# Patient Record
Sex: Female | Born: 1970 | Race: White | Hispanic: Yes | Smoking: Never smoker
Health system: Southern US, Community
[De-identification: ages and names within clinical notes are randomized; demographics above are authoritative.]

---

## 1997-11-30 ENCOUNTER — Other Ambulatory Visit: Admission: RE | Admit: 1997-11-30 | Discharge: 1997-11-30 | Payer: Self-pay | Admitting: Family Medicine

## 1998-09-09 ENCOUNTER — Other Ambulatory Visit: Admission: RE | Admit: 1998-09-09 | Discharge: 1998-09-09 | Payer: Self-pay | Admitting: Family Medicine

## 1999-07-15 ENCOUNTER — Emergency Department (HOSPITAL_COMMUNITY): Admission: EM | Admit: 1999-07-15 | Discharge: 1999-07-15 | Payer: Self-pay | Admitting: Emergency Medicine

## 2005-01-28 ENCOUNTER — Emergency Department (HOSPITAL_COMMUNITY): Admission: EM | Admit: 2005-01-28 | Discharge: 2005-01-28 | Payer: Self-pay | Admitting: Emergency Medicine

## 2005-04-26 ENCOUNTER — Ambulatory Visit (HOSPITAL_COMMUNITY): Admission: RE | Admit: 2005-04-26 | Discharge: 2005-04-26 | Payer: Self-pay | Admitting: *Deleted

## 2005-09-26 ENCOUNTER — Inpatient Hospital Stay (HOSPITAL_COMMUNITY): Admission: AD | Admit: 2005-09-26 | Discharge: 2005-09-30 | Payer: Self-pay | Admitting: *Deleted

## 2005-09-26 ENCOUNTER — Ambulatory Visit: Payer: Self-pay | Admitting: Obstetrics and Gynecology

## 2012-10-25 ENCOUNTER — Other Ambulatory Visit (HOSPITAL_COMMUNITY): Payer: Self-pay | Admitting: Nurse Practitioner

## 2012-10-25 DIAGNOSIS — Z1231 Encounter for screening mammogram for malignant neoplasm of breast: Secondary | ICD-10-CM

## 2012-11-13 ENCOUNTER — Ambulatory Visit (HOSPITAL_COMMUNITY)
Admission: RE | Admit: 2012-11-13 | Discharge: 2012-11-13 | Disposition: A | Discharge status: Home / Self Care | Payer: Self-pay | Source: Ambulatory Visit | Attending: Nurse Practitioner | Admitting: Nurse Practitioner

## 2012-11-13 DIAGNOSIS — Z1231 Encounter for screening mammogram for malignant neoplasm of breast: Secondary | ICD-10-CM

## 2017-02-07 ENCOUNTER — Other Ambulatory Visit: Payer: Self-pay | Admitting: Nurse Practitioner

## 2017-02-07 DIAGNOSIS — Z1231 Encounter for screening mammogram for malignant neoplasm of breast: Secondary | ICD-10-CM

## 2017-07-10 ENCOUNTER — Emergency Department (HOSPITAL_COMMUNITY)
Admission: EM | Admit: 2017-07-10 | Discharge: 2017-07-10 | Disposition: A | Discharge status: Home / Self Care | Payer: No Typology Code available for payment source | Attending: Emergency Medicine | Admitting: Emergency Medicine

## 2017-07-10 ENCOUNTER — Emergency Department (HOSPITAL_COMMUNITY): Payer: No Typology Code available for payment source

## 2017-07-10 ENCOUNTER — Other Ambulatory Visit: Payer: Self-pay

## 2017-07-10 ENCOUNTER — Encounter (HOSPITAL_COMMUNITY): Payer: Self-pay | Admitting: Emergency Medicine

## 2017-07-10 DIAGNOSIS — M542 Cervicalgia: Secondary | ICD-10-CM | POA: Diagnosis present

## 2017-07-10 DIAGNOSIS — R51 Headache: Secondary | ICD-10-CM | POA: Insufficient documentation

## 2017-07-10 DIAGNOSIS — H9202 Otalgia, left ear: Secondary | ICD-10-CM | POA: Diagnosis not present

## 2017-07-10 DIAGNOSIS — M25512 Pain in left shoulder: Secondary | ICD-10-CM | POA: Insufficient documentation

## 2017-07-10 MED ORDER — METHOCARBAMOL 500 MG PO TABS: 500.0000 mg | ORAL_TABLET | Freq: Three times a day (TID) | ORAL | 0 refills | Status: DC | PRN

## 2017-07-10 NOTE — ED Notes (Signed)
Pt refused xray, sent back to ED
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
## 2017-07-10 NOTE — Discharge Instructions (Signed)
You demanded x-rays and CT scans. All were normal.   Take tylenol and ibuprofen for pain. Robaxin for muscle stiffness and spasms.
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
## 2017-07-10 NOTE — ED Notes (Signed)
Spoke with provider regarding patient refusing xray. Provider speaking with patient and family member at bedside.

## 2017-07-11 ENCOUNTER — Emergency Department (HOSPITAL_COMMUNITY)
Admission: EM | Admit: 2017-07-11 | Discharge: 2017-07-11 | Disposition: A | Discharge status: Home / Self Care | Payer: No Typology Code available for payment source | Attending: Emergency Medicine | Admitting: Emergency Medicine

## 2017-07-11 ENCOUNTER — Encounter (HOSPITAL_COMMUNITY): Payer: Self-pay | Admitting: Emergency Medicine

## 2017-07-11 DIAGNOSIS — M549 Dorsalgia, unspecified: Secondary | ICD-10-CM | POA: Diagnosis not present

## 2017-07-11 DIAGNOSIS — R51 Headache: Secondary | ICD-10-CM | POA: Diagnosis not present

## 2017-07-11 DIAGNOSIS — M7918 Myalgia, other site: Secondary | ICD-10-CM

## 2017-07-11 DIAGNOSIS — Y92410 Unspecified street and highway as the place of occurrence of the external cause: Secondary | ICD-10-CM | POA: Diagnosis not present

## 2017-07-11 DIAGNOSIS — Y999 Unspecified external cause status: Secondary | ICD-10-CM | POA: Diagnosis not present

## 2017-07-11 DIAGNOSIS — Y9389 Activity, other specified: Secondary | ICD-10-CM | POA: Insufficient documentation

## 2017-07-11 MED ORDER — ACETAMINOPHEN 325 MG PO TABS
650.0000 mg | ORAL_TABLET | Freq: Once | ORAL | Status: AC
  Administered 2017-07-11: 650 mg via ORAL
  Filled 2017-07-11: qty 2

## 2017-07-11 NOTE — ED Triage Notes (Signed)
Unable to locate PT in front lobby. X-ray  Dept called , unable to located PT. Ct Dept called unable to locate Pt.

## 2017-07-11 NOTE — Discharge Instructions (Signed)
Please read attached information. If you experience any new or worsening signs or symptoms please return to the emergency room for evaluation. Please follow-up with your primary care provider or specialist as discussed.  °
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
## 2017-07-11 NOTE — ED Notes (Signed)
Declined W/C at D/C and was escorted to lobby by RN. 
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
## 2017-07-23 ENCOUNTER — Ambulatory Visit (INDEPENDENT_AMBULATORY_CARE_PROVIDER_SITE_OTHER): Payer: Self-pay | Admitting: Orthopaedic Surgery

## 2017-07-23 ENCOUNTER — Ambulatory Visit (INDEPENDENT_AMBULATORY_CARE_PROVIDER_SITE_OTHER): Payer: Self-pay

## 2017-07-23 ENCOUNTER — Encounter (INDEPENDENT_AMBULATORY_CARE_PROVIDER_SITE_OTHER): Payer: Self-pay | Admitting: Orthopaedic Surgery

## 2017-07-23 DIAGNOSIS — M5441 Lumbago with sciatica, right side: Secondary | ICD-10-CM | POA: Insufficient documentation

## 2017-07-23 DIAGNOSIS — M545 Low back pain: Secondary | ICD-10-CM

## 2017-07-23 DIAGNOSIS — M25561 Pain in right knee: Secondary | ICD-10-CM

## 2017-07-23 DIAGNOSIS — M25511 Pain in right shoulder: Secondary | ICD-10-CM

## 2017-07-23 DIAGNOSIS — G8929 Other chronic pain: Secondary | ICD-10-CM | POA: Insufficient documentation

## 2017-07-23 MED ORDER — PREDNISONE 10 MG (21) PO TBPK: ORAL_TABLET | ORAL | 0 refills | Status: DC

## 2017-07-23 MED ORDER — METHOCARBAMOL 500 MG PO TABS: 500.0000 mg | ORAL_TABLET | Freq: Three times a day (TID) | ORAL | 0 refills | Status: DC | PRN
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
## 2017-08-03 ENCOUNTER — Encounter: Payer: Self-pay | Admitting: Internal Medicine

## 2017-08-03 ENCOUNTER — Ambulatory Visit (INDEPENDENT_AMBULATORY_CARE_PROVIDER_SITE_OTHER): Payer: Self-pay | Admitting: Internal Medicine

## 2017-08-03 DIAGNOSIS — H9192 Unspecified hearing loss, left ear: Secondary | ICD-10-CM

## 2017-08-03 DIAGNOSIS — F419 Anxiety disorder, unspecified: Secondary | ICD-10-CM

## 2017-08-03 DIAGNOSIS — R202 Paresthesia of skin: Secondary | ICD-10-CM

## 2017-08-03 DIAGNOSIS — R450 Nervousness: Secondary | ICD-10-CM

## 2017-08-03 DIAGNOSIS — M542 Cervicalgia: Secondary | ICD-10-CM

## 2017-08-03 DIAGNOSIS — G8911 Acute pain due to trauma: Secondary | ICD-10-CM

## 2017-08-03 DIAGNOSIS — M545 Low back pain: Secondary | ICD-10-CM

## 2017-08-03 DIAGNOSIS — M791 Myalgia, unspecified site: Secondary | ICD-10-CM

## 2017-08-03 MED ORDER — NAPROXEN 500 MG PO TABS: 500.0000 mg | ORAL_TABLET | Freq: Two times a day (BID) | ORAL | 0 refills | Status: DC

## 2017-08-03 MED ORDER — CYCLOBENZAPRINE HCL 5 MG PO TABS: 5.0000 mg | ORAL_TABLET | Freq: Three times a day (TID) | ORAL | 0 refills | Status: DC | PRN

## 2017-08-03 NOTE — Progress Notes (Signed)
Internal Medicine Clinic Attending  Case discussed with Dr. Wallace at the time of the visit.  We reviewed the resident's history and exam and pertinent patient test results.  I agree with the assessment, diagnosis, and plan of care documented in the resident's note.  
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
## 2017-08-03 NOTE — Patient Instructions (Signed)
FOLLOW-UP INSTRUCTIONS When: 2-4 weeks For: pain What to bring: medications  I want to check a couple labs today to make sure you are not having ongoing muscle injury to explain your pain.  Otherwise I have prescribed 2 medications  1. Flexeril for muscle spasm 2. Naproxen for pain  Take both only as needed.  Also follow up with orthopedics as planned.
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
## 2017-08-04 LAB — BMP8+ANION GAP
Anion Gap: 15 mmol/L (ref 10.0–18.0)
BUN / CREAT RATIO: 22 (ref 9–23)
BUN: 14 mg/dL (ref 6–24)
CALCIUM: 9.2 mg/dL (ref 8.7–10.2)
CO2: 22 mmol/L (ref 20–29)
CREATININE: 0.65 mg/dL (ref 0.57–1.00)
Chloride: 104 mmol/L (ref 96–106)
GFR calc non Af Amer: 108 mL/min/{1.73_m2} (ref >59)
GFR, EST AFRICAN AMERICAN: 125 mL/min/{1.73_m2} (ref >59)
Glucose: 86 mg/dL (ref 65–99)
Potassium: 4.2 mmol/L (ref 3.5–5.2)
Sodium: 141 mmol/L (ref 134–144)

## 2017-08-04 LAB — CK: CK TOTAL: 67 U/L (ref 24–173)

## 2017-08-09 ENCOUNTER — Ambulatory Visit: Payer: Self-pay

## 2017-08-17 ENCOUNTER — Encounter: Payer: Self-pay | Admitting: Internal Medicine

## 2017-08-17 ENCOUNTER — Ambulatory Visit (INDEPENDENT_AMBULATORY_CARE_PROVIDER_SITE_OTHER): Payer: Self-pay | Admitting: Internal Medicine

## 2017-08-17 ENCOUNTER — Other Ambulatory Visit: Payer: Self-pay

## 2017-08-17 DIAGNOSIS — F332 Major depressive disorder, recurrent severe without psychotic features: Secondary | ICD-10-CM

## 2017-08-17 DIAGNOSIS — Z79899 Other long term (current) drug therapy: Secondary | ICD-10-CM

## 2017-08-17 DIAGNOSIS — H9190 Unspecified hearing loss, unspecified ear: Secondary | ICD-10-CM | POA: Insufficient documentation

## 2017-08-17 DIAGNOSIS — F419 Anxiety disorder, unspecified: Secondary | ICD-10-CM

## 2017-08-17 DIAGNOSIS — R002 Palpitations: Secondary | ICD-10-CM

## 2017-08-17 DIAGNOSIS — F430 Acute stress reaction: Secondary | ICD-10-CM

## 2017-08-17 DIAGNOSIS — M542 Cervicalgia: Secondary | ICD-10-CM

## 2017-08-17 DIAGNOSIS — G8911 Acute pain due to trauma: Secondary | ICD-10-CM

## 2017-08-17 DIAGNOSIS — R51 Headache: Secondary | ICD-10-CM

## 2017-08-17 MED ORDER — NAPROXEN 500 MG PO TABS: 500.0000 mg | ORAL_TABLET | Freq: Two times a day (BID) | ORAL | 0 refills | Status: DC

## 2017-08-17 MED ORDER — DULOXETINE HCL 30 MG PO CPEP: 30.0000 mg | ORAL_CAPSULE | Freq: Every day | ORAL | 2 refills | Status: DC

## 2017-08-17 MED ORDER — CYCLOBENZAPRINE HCL 5 MG PO TABS: 5.0000 mg | ORAL_TABLET | Freq: Three times a day (TID) | ORAL | 1 refills | Status: DC | PRN
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
## 2017-08-23 NOTE — Progress Notes (Signed)
Internal Medicine Clinic Attending  Case discussed with Dr. Molt at the time of the visit.  We reviewed the resident's history and exam and pertinent patient test results.  I agree with the assessment, diagnosis, and plan of care documented in the resident's note. 

## 2017-08-29 ENCOUNTER — Ambulatory Visit: Payer: Self-pay

## 2017-09-03 ENCOUNTER — Ambulatory Visit: Payer: Self-pay

## 2017-09-06 ENCOUNTER — Telehealth: Payer: Self-pay | Admitting: Internal Medicine

## 2017-09-06 NOTE — Telephone Encounter (Signed)
Pt called to notify you that she now has the [place] and would like her ENT Referral placed.

## 2017-09-07 NOTE — Telephone Encounter (Signed)
I never met this patient, please schedule an appointment with me.

## 2017-09-10 ENCOUNTER — Encounter (INDEPENDENT_AMBULATORY_CARE_PROVIDER_SITE_OTHER): Payer: Self-pay | Admitting: Orthopaedic Surgery

## 2017-09-10 ENCOUNTER — Ambulatory Visit (INDEPENDENT_AMBULATORY_CARE_PROVIDER_SITE_OTHER): Payer: Self-pay

## 2017-09-10 ENCOUNTER — Ambulatory Visit (INDEPENDENT_AMBULATORY_CARE_PROVIDER_SITE_OTHER): Payer: Self-pay | Admitting: Orthopaedic Surgery

## 2017-09-10 DIAGNOSIS — M545 Low back pain: Secondary | ICD-10-CM

## 2017-09-10 DIAGNOSIS — G8929 Other chronic pain: Secondary | ICD-10-CM

## 2017-09-10 DIAGNOSIS — M25511 Pain in right shoulder: Secondary | ICD-10-CM

## 2017-09-10 DIAGNOSIS — M25562 Pain in left knee: Secondary | ICD-10-CM

## 2017-09-10 NOTE — Addendum Note (Signed)
Addended by: Brand Males E on: 09/10/2017 11:44 AM   Modules accepted: Orders
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
## 2017-09-12 NOTE — Telephone Encounter (Signed)
Patient is sch to see you on 09/28/2017.

## 2017-09-12 NOTE — Telephone Encounter (Signed)
Ok, thanks.

## 2017-09-17 ENCOUNTER — Ambulatory Visit: Payer: No Typology Code available for payment source | Attending: Orthopaedic Surgery | Admitting: Physical Therapy

## 2017-09-17 ENCOUNTER — Other Ambulatory Visit: Payer: Self-pay

## 2017-09-17 ENCOUNTER — Encounter: Payer: Self-pay | Admitting: Physical Therapy

## 2017-09-17 DIAGNOSIS — M25611 Stiffness of right shoulder, not elsewhere classified: Secondary | ICD-10-CM

## 2017-09-17 DIAGNOSIS — M5442 Lumbago with sciatica, left side: Secondary | ICD-10-CM | POA: Diagnosis present

## 2017-09-17 DIAGNOSIS — M5441 Lumbago with sciatica, right side: Secondary | ICD-10-CM

## 2017-09-17 DIAGNOSIS — R262 Difficulty in walking, not elsewhere classified: Secondary | ICD-10-CM | POA: Diagnosis present

## 2017-09-17 DIAGNOSIS — M6281 Muscle weakness (generalized): Secondary | ICD-10-CM | POA: Diagnosis present

## 2017-09-17 DIAGNOSIS — M25511 Pain in right shoulder: Secondary | ICD-10-CM | POA: Insufficient documentation
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
## 2017-09-19 ENCOUNTER — Ambulatory Visit
Admission: RE | Admit: 2017-09-19 | Discharge: 2017-09-19 | Disposition: A | Discharge status: Home / Self Care | Payer: Self-pay | Source: Ambulatory Visit | Attending: Orthopaedic Surgery | Admitting: Orthopaedic Surgery

## 2017-09-19 DIAGNOSIS — M545 Low back pain: Secondary | ICD-10-CM

## 2017-09-24 ENCOUNTER — Encounter (INDEPENDENT_AMBULATORY_CARE_PROVIDER_SITE_OTHER): Payer: Self-pay | Admitting: Orthopaedic Surgery

## 2017-09-24 ENCOUNTER — Ambulatory Visit (INDEPENDENT_AMBULATORY_CARE_PROVIDER_SITE_OTHER): Payer: Self-pay | Admitting: Orthopaedic Surgery

## 2017-09-24 DIAGNOSIS — M5416 Radiculopathy, lumbar region: Secondary | ICD-10-CM
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
## 2017-09-24 NOTE — Addendum Note (Signed)
Addended by: Precious Bard on: 09/24/2017 09:52 AM   Modules accepted: Orders

## 2017-09-25 ENCOUNTER — Encounter: Payer: Self-pay | Admitting: Physical Therapy

## 2017-09-25 ENCOUNTER — Ambulatory Visit: Payer: No Typology Code available for payment source | Admitting: Physical Therapy

## 2017-09-25 DIAGNOSIS — M25611 Stiffness of right shoulder, not elsewhere classified: Secondary | ICD-10-CM

## 2017-09-25 DIAGNOSIS — M5442 Lumbago with sciatica, left side: Secondary | ICD-10-CM

## 2017-09-25 DIAGNOSIS — R262 Difficulty in walking, not elsewhere classified: Secondary | ICD-10-CM

## 2017-09-25 DIAGNOSIS — M6281 Muscle weakness (generalized): Secondary | ICD-10-CM

## 2017-09-25 DIAGNOSIS — M25511 Pain in right shoulder: Secondary | ICD-10-CM

## 2017-09-25 DIAGNOSIS — M5441 Lumbago with sciatica, right side: Secondary | ICD-10-CM
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
## 2017-09-27 ENCOUNTER — Ambulatory Visit: Payer: No Typology Code available for payment source | Admitting: Physical Therapy

## 2017-09-27 ENCOUNTER — Encounter: Payer: Self-pay | Admitting: Physical Therapy

## 2017-09-27 DIAGNOSIS — M5442 Lumbago with sciatica, left side: Secondary | ICD-10-CM | POA: Diagnosis not present

## 2017-09-27 DIAGNOSIS — M25611 Stiffness of right shoulder, not elsewhere classified: Secondary | ICD-10-CM

## 2017-09-27 DIAGNOSIS — M25511 Pain in right shoulder: Secondary | ICD-10-CM

## 2017-09-27 DIAGNOSIS — R262 Difficulty in walking, not elsewhere classified: Secondary | ICD-10-CM

## 2017-09-27 DIAGNOSIS — M6281 Muscle weakness (generalized): Secondary | ICD-10-CM

## 2017-09-27 DIAGNOSIS — M5441 Lumbago with sciatica, right side: Secondary | ICD-10-CM
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
## 2017-09-27 NOTE — Patient Instructions (Signed)
   Supin e figure four  Lay on your back. Bend your knees bringing your feet toward your gluts. Cross one foot over one knee. Raise knee that has foot placed over it up to a 90 degree angle. Pull knee towards your chest for a greater stretch.  You should feel a stretch your hip.  Hold for 30 seconds, then relax.  Repeat 3 times each side, twice a day.    HAMSTRING STRETCH WITH TOWEL  While lying down on your back, hook a towel or strap under  your foot and draw up your leg until a stretch is felt along the backside of your leg.   Keep your knee in a straightened position during the stretch.  Hold for 30 seconds.  Repeat 3 times each side, twice a day.

## 2017-09-28 ENCOUNTER — Encounter: Payer: Self-pay | Admitting: Internal Medicine

## 2017-09-28 ENCOUNTER — Other Ambulatory Visit: Payer: Self-pay

## 2017-09-28 ENCOUNTER — Ambulatory Visit (INDEPENDENT_AMBULATORY_CARE_PROVIDER_SITE_OTHER): Payer: Self-pay | Admitting: Internal Medicine

## 2017-09-28 VITALS — BP 123/81 | HR 72 | Temp 98.6°F | Ht 64.0 in | Wt 195.8 lb

## 2017-09-28 DIAGNOSIS — K640 First degree hemorrhoids: Secondary | ICD-10-CM

## 2017-09-28 DIAGNOSIS — H9192 Unspecified hearing loss, left ear: Secondary | ICD-10-CM

## 2017-09-28 DIAGNOSIS — G060 Intracranial abscess and granuloma: Secondary | ICD-10-CM

## 2017-09-28 DIAGNOSIS — M542 Cervicalgia: Secondary | ICD-10-CM

## 2017-09-28 DIAGNOSIS — G4452 New daily persistent headache (NDPH): Secondary | ICD-10-CM

## 2017-09-28 DIAGNOSIS — G8911 Acute pain due to trauma: Secondary | ICD-10-CM

## 2017-09-28 DIAGNOSIS — M545 Low back pain: Secondary | ICD-10-CM

## 2017-09-28 DIAGNOSIS — Z23 Encounter for immunization: Secondary | ICD-10-CM

## 2017-09-28 DIAGNOSIS — Z0000 Encounter for general adult medical examination without abnormal findings: Secondary | ICD-10-CM

## 2017-09-28 DIAGNOSIS — M25512 Pain in left shoulder: Secondary | ICD-10-CM

## 2017-09-28 DIAGNOSIS — M25511 Pain in right shoulder: Secondary | ICD-10-CM

## 2017-09-28 MED ORDER — NAPROXEN 500 MG PO TABS: 500.0000 mg | ORAL_TABLET | Freq: Two times a day (BID) | ORAL | 0 refills | Status: DC

## 2017-09-28 MED ORDER — PSYLLIUM 28 % PO PACK: 1.0000 | PACK | Freq: Two times a day (BID) | ORAL | 11 refills | Status: DC

## 2017-09-28 MED ORDER — CYCLOBENZAPRINE HCL 5 MG PO TABS: 5.0000 mg | ORAL_TABLET | Freq: Three times a day (TID) | ORAL | 1 refills | Status: DC | PRN

## 2017-09-28 MED ORDER — DIBUCAINE 1 % RE OINT: 1.0000 "application " | TOPICAL_OINTMENT | RECTAL | 0 refills | Status: DC | PRN
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
## 2017-09-28 NOTE — Assessment & Plan Note (Signed)
She was having a millimeter 3 hemorrhoids.  She was given a prescription of Metamucil and told to increase fiber intake to keep her bowel regular with soft stools. Dibucaine rectal cream to be applied before BM.

## 2017-09-28 NOTE — Patient Instructions (Addendum)
Thank you for visiting clinic today. I am ordering an brain MRI today. I am also giving you a referral to see an ENT specialist for your hearing loss. I am giving you a cream to use whenever you pass stool to help with your hemorrhoid pain. Please use Metamucil or any fiber aid to keep your stool very Soft. Please follow-up 1 week after your MRI.
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
## 2017-09-28 NOTE — Assessment & Plan Note (Signed)
Continue to get pains in her back, neck and bilateral shoulder. According to patient physical therapy is helping but there is a very slow progress.  She was asking for a refill for naproxen and Flexeril as they do help with her pain which was provided.

## 2017-09-28 NOTE — Assessment & Plan Note (Signed)
She was provided with Tdap today. According to patient she has had HIV tested and Pap smear done at health department recently and they told her that everything is normal.  We will try getting records from health department.
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
## 2017-10-01 ENCOUNTER — Encounter: Payer: Self-pay | Admitting: Physical Therapy

## 2017-10-01 ENCOUNTER — Ambulatory Visit: Payer: No Typology Code available for payment source | Admitting: Physical Therapy

## 2017-10-01 DIAGNOSIS — M5442 Lumbago with sciatica, left side: Secondary | ICD-10-CM

## 2017-10-01 DIAGNOSIS — M25611 Stiffness of right shoulder, not elsewhere classified: Secondary | ICD-10-CM

## 2017-10-01 DIAGNOSIS — M5441 Lumbago with sciatica, right side: Secondary | ICD-10-CM

## 2017-10-01 DIAGNOSIS — R262 Difficulty in walking, not elsewhere classified: Secondary | ICD-10-CM

## 2017-10-01 DIAGNOSIS — M25511 Pain in right shoulder: Secondary | ICD-10-CM

## 2017-10-01 DIAGNOSIS — M6281 Muscle weakness (generalized): Secondary | ICD-10-CM
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
## 2017-10-01 NOTE — Progress Notes (Signed)
Internal Medicine Clinic Attending  Case discussed with Dr. Amin at the time of the visit.  We reviewed the resident's history and exam and pertinent patient test results.  I agree with the assessment, diagnosis, and plan of care documented in the resident's note.    

## 2017-10-05 ENCOUNTER — Encounter: Payer: Self-pay | Admitting: Physical Therapy

## 2017-10-05 ENCOUNTER — Ambulatory Visit: Payer: No Typology Code available for payment source | Admitting: Physical Therapy

## 2017-10-05 DIAGNOSIS — M25511 Pain in right shoulder: Secondary | ICD-10-CM

## 2017-10-05 DIAGNOSIS — R262 Difficulty in walking, not elsewhere classified: Secondary | ICD-10-CM

## 2017-10-05 DIAGNOSIS — M5441 Lumbago with sciatica, right side: Secondary | ICD-10-CM

## 2017-10-05 DIAGNOSIS — M5442 Lumbago with sciatica, left side: Secondary | ICD-10-CM

## 2017-10-05 DIAGNOSIS — M6281 Muscle weakness (generalized): Secondary | ICD-10-CM

## 2017-10-05 DIAGNOSIS — M25611 Stiffness of right shoulder, not elsewhere classified: Secondary | ICD-10-CM
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
## 2017-10-09 ENCOUNTER — Ambulatory Visit (HOSPITAL_COMMUNITY)
Admission: RE | Admit: 2017-10-09 | Discharge: 2017-10-09 | Disposition: A | Discharge status: Home / Self Care | Payer: Self-pay | Source: Ambulatory Visit | Attending: Internal Medicine | Admitting: Internal Medicine

## 2017-10-09 DIAGNOSIS — Z8673 Personal history of transient ischemic attack (TIA), and cerebral infarction without residual deficits: Secondary | ICD-10-CM | POA: Insufficient documentation

## 2017-10-09 DIAGNOSIS — G4452 New daily persistent headache (NDPH): Secondary | ICD-10-CM | POA: Insufficient documentation

## 2017-10-09 MED ORDER — GADOBENATE DIMEGLUMINE 529 MG/ML IV SOLN
20.0000 mL | Freq: Once | INTRAVENOUS | Status: AC
  Administered 2017-10-09: 18 mL via INTRAVENOUS

## 2017-10-10 ENCOUNTER — Ambulatory Visit: Payer: No Typology Code available for payment source | Admitting: Physical Therapy

## 2017-10-10 ENCOUNTER — Encounter: Payer: Self-pay | Admitting: Physical Therapy

## 2017-10-10 DIAGNOSIS — R262 Difficulty in walking, not elsewhere classified: Secondary | ICD-10-CM

## 2017-10-10 DIAGNOSIS — M5442 Lumbago with sciatica, left side: Secondary | ICD-10-CM

## 2017-10-10 DIAGNOSIS — M25611 Stiffness of right shoulder, not elsewhere classified: Secondary | ICD-10-CM

## 2017-10-10 DIAGNOSIS — M25511 Pain in right shoulder: Secondary | ICD-10-CM

## 2017-10-10 DIAGNOSIS — M6281 Muscle weakness (generalized): Secondary | ICD-10-CM

## 2017-10-10 DIAGNOSIS — M5441 Lumbago with sciatica, right side: Secondary | ICD-10-CM
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
## 2017-10-12 ENCOUNTER — Ambulatory Visit: Payer: No Typology Code available for payment source | Admitting: Physical Therapy

## 2017-10-12 ENCOUNTER — Encounter: Payer: Self-pay | Admitting: Internal Medicine

## 2017-10-12 ENCOUNTER — Other Ambulatory Visit: Payer: Self-pay

## 2017-10-12 ENCOUNTER — Encounter: Payer: Self-pay | Admitting: Physical Therapy

## 2017-10-12 ENCOUNTER — Ambulatory Visit (INDEPENDENT_AMBULATORY_CARE_PROVIDER_SITE_OTHER): Payer: Self-pay | Admitting: Internal Medicine

## 2017-10-12 DIAGNOSIS — M5442 Lumbago with sciatica, left side: Secondary | ICD-10-CM | POA: Diagnosis not present

## 2017-10-12 DIAGNOSIS — M5441 Lumbago with sciatica, right side: Secondary | ICD-10-CM

## 2017-10-12 DIAGNOSIS — G4452 New daily persistent headache (NDPH): Secondary | ICD-10-CM

## 2017-10-12 DIAGNOSIS — H9192 Unspecified hearing loss, left ear: Secondary | ICD-10-CM

## 2017-10-12 DIAGNOSIS — M25511 Pain in right shoulder: Secondary | ICD-10-CM

## 2017-10-12 DIAGNOSIS — R262 Difficulty in walking, not elsewhere classified: Secondary | ICD-10-CM

## 2017-10-12 DIAGNOSIS — M25611 Stiffness of right shoulder, not elsewhere classified: Secondary | ICD-10-CM

## 2017-10-12 DIAGNOSIS — M6281 Muscle weakness (generalized): Secondary | ICD-10-CM
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
## 2017-10-12 NOTE — Assessment & Plan Note (Signed)
She continued to experience daily nonspecific headaches with no associated symptoms.  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  MRI was negative for any sign of cysticercosis, most likely an artifact during CT head. 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  Patient was assured that there is nothing wrong with her brain. He was advised to keep herself well-hydrated. She was taking naproxen daily for her generalized aches and pains, I advised her to stop taking naproxen daily only take it once in a while as needed as taking a pain medicine daily can cause headaches.

## 2017-10-12 NOTE — Assessment & Plan Note (Signed)
She continued to experience aches and pains, physical therapy is not very helpful.  She was advised to continue her physical therapy sessions and continue doing those exercises to strengthen her muscles as these types of muscle injuries can last for some long time.  She was also advised to stop taking daily naproxen and try if other coping skills can help with her pain.

## 2017-10-14 NOTE — Progress Notes (Signed)
Internal Medicine Clinic Attending  Case discussed with Dr. Amin at the time of the visit.  We reviewed the resident's history and exam and pertinent patient test results.  I agree with the assessment, diagnosis, and plan of care documented in the resident's note.    

## 2017-10-15 ENCOUNTER — Ambulatory Visit (INDEPENDENT_AMBULATORY_CARE_PROVIDER_SITE_OTHER): Payer: Self-pay

## 2017-10-15 ENCOUNTER — Encounter

## 2017-10-15 ENCOUNTER — Encounter (INDEPENDENT_AMBULATORY_CARE_PROVIDER_SITE_OTHER): Payer: Self-pay | Admitting: Physical Medicine and Rehabilitation

## 2017-10-15 ENCOUNTER — Ambulatory Visit (INDEPENDENT_AMBULATORY_CARE_PROVIDER_SITE_OTHER): Payer: Self-pay | Admitting: Physical Medicine and Rehabilitation

## 2017-10-15 VITALS — BP 138/92 | HR 56

## 2017-10-15 DIAGNOSIS — M5416 Radiculopathy, lumbar region: Secondary | ICD-10-CM

## 2017-10-15 DIAGNOSIS — M5116 Intervertebral disc disorders with radiculopathy, lumbar region: Secondary | ICD-10-CM

## 2017-10-15 MED ORDER — BETAMETHASONE SOD PHOS & ACET 6 (3-3) MG/ML IJ SUSP: 12.0000 mg | Freq: Once | INTRAMUSCULAR | Status: AC

## 2017-10-15 NOTE — Progress Notes (Signed)
 .  Numeric Pain Rating Scale and Functional Assessment Average Pain 8   In the last MONTH (on 0-10 scale) has pain interfered with the following?  1. General activity like being  able to carry out your everyday physical activities such as walking, climbing stairs, carrying groceries, or moving a chair?  Rating(6)   +Driver, -BT, -Dye Allergies.  

## 2017-10-15 NOTE — Patient Instructions (Signed)

## 2017-10-16 ENCOUNTER — Encounter: Payer: Self-pay | Admitting: Physical Therapy
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
## 2017-10-16 NOTE — Procedures (Signed)
Lumbar Epidural Steroid Injection - Interlaminar Approach with Fluoroscopic Guidance  Patient: Sara Olson      Date of Birth: 1970/07/08 MRN: [id number] PCP: Lorella Nimrod, MD      Visit Date: 10/15/2017   Universal Protocol:     Consent Given By: the patient  Position: PRONE  Additional Comments: Vital signs were monitored before and after the procedure. Patient was prepped and draped in the usual sterile fashion. The correct patient, procedure, and site was verified.   Injection Procedure Details:  Procedure Site One Meds Administered:  Meds ordered this encounter  Medications  . betamethasone acetate-betamethasone sodium phosphate (CELESTONE) injection 12 mg     Laterality: Right  Location/Site:  L4-L5  Needle size: 20 G  Needle type: Tuohy  Needle Placement: Paramedian epidural  Findings:   -Comments: Excellent flow of contrast into the epidural space.  Procedure Details: Using a paramedian approach from the side mentioned above, the region overlying the inferior lamina was localized under fluoroscopic visualization and the soft tissues overlying this structure were infiltrated with 4 ml. of 1% Lidocaine without Epinephrine. The Tuohy needle was inserted into the epidural space using a paramedian approach.   The epidural space was localized using loss of resistance along with lateral and bi-planar fluoroscopic views.  After negative aspirate for air, blood, and CSF, a 2 ml. volume of Isovue-250 was injected into the epidural space and the flow of contrast was observed. Radiographs were obtained for documentation purposes.    The injectate was administered into the level noted above.   Additional Comments:  The patient tolerated the procedure well Dressing: Band-Aid    Post-procedure details: Patient was observed during the procedure. Post-procedure instructions were reviewed.  Patient left the clinic in stable condition.

## 2017-10-18 ENCOUNTER — Encounter: Payer: Self-pay | Admitting: Physical Therapy

## 2017-10-23 ENCOUNTER — Encounter: Payer: Self-pay | Admitting: Physical Therapy

## 2017-10-23 ENCOUNTER — Ambulatory Visit: Payer: No Typology Code available for payment source | Attending: Orthopaedic Surgery | Admitting: Physical Therapy

## 2017-10-23 DIAGNOSIS — M6281 Muscle weakness (generalized): Secondary | ICD-10-CM | POA: Insufficient documentation

## 2017-10-23 DIAGNOSIS — M5441 Lumbago with sciatica, right side: Secondary | ICD-10-CM | POA: Diagnosis present

## 2017-10-23 DIAGNOSIS — M25511 Pain in right shoulder: Secondary | ICD-10-CM | POA: Diagnosis present

## 2017-10-23 DIAGNOSIS — R262 Difficulty in walking, not elsewhere classified: Secondary | ICD-10-CM | POA: Insufficient documentation

## 2017-10-23 DIAGNOSIS — M5442 Lumbago with sciatica, left side: Secondary | ICD-10-CM | POA: Diagnosis present

## 2017-10-23 DIAGNOSIS — M25611 Stiffness of right shoulder, not elsewhere classified: Secondary | ICD-10-CM | POA: Diagnosis present
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
## 2017-10-25 ENCOUNTER — Encounter: Payer: Self-pay | Admitting: Physical Therapy

## 2017-10-25 ENCOUNTER — Ambulatory Visit: Payer: No Typology Code available for payment source | Admitting: Physical Therapy

## 2017-10-25 DIAGNOSIS — M25511 Pain in right shoulder: Secondary | ICD-10-CM | POA: Diagnosis not present

## 2017-10-25 DIAGNOSIS — M5442 Lumbago with sciatica, left side: Secondary | ICD-10-CM

## 2017-10-25 DIAGNOSIS — M5441 Lumbago with sciatica, right side: Secondary | ICD-10-CM

## 2017-10-25 DIAGNOSIS — M6281 Muscle weakness (generalized): Secondary | ICD-10-CM

## 2017-10-25 DIAGNOSIS — M25611 Stiffness of right shoulder, not elsewhere classified: Secondary | ICD-10-CM

## 2017-10-25 DIAGNOSIS — R262 Difficulty in walking, not elsewhere classified: Secondary | ICD-10-CM
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
## 2017-10-30 ENCOUNTER — Encounter: Payer: Self-pay | Admitting: Physical Therapy

## 2017-10-30 ENCOUNTER — Ambulatory Visit: Payer: No Typology Code available for payment source | Admitting: Physical Therapy

## 2017-10-30 DIAGNOSIS — M25511 Pain in right shoulder: Secondary | ICD-10-CM

## 2017-10-30 DIAGNOSIS — M6281 Muscle weakness (generalized): Secondary | ICD-10-CM

## 2017-10-30 DIAGNOSIS — M5442 Lumbago with sciatica, left side: Secondary | ICD-10-CM

## 2017-10-30 DIAGNOSIS — M5441 Lumbago with sciatica, right side: Secondary | ICD-10-CM

## 2017-10-30 DIAGNOSIS — R262 Difficulty in walking, not elsewhere classified: Secondary | ICD-10-CM

## 2017-10-30 DIAGNOSIS — M25611 Stiffness of right shoulder, not elsewhere classified: Secondary | ICD-10-CM

## 2017-10-30 NOTE — Patient Instructions (Signed)
   STANDING CALF STRETCH  - GASTROCNEMIUS  Start by standing in front of a wall or other sturdy object. Step forward with one foot and maintain your toes on both feet to be pointed straight forward. Keep the leg behind you with a straight knee during the stretch.   Lean forward towards the wall and support yourself with your arms as you allow your front knee to bend until a gentle stretch is felt along the back of your leg that is most behind you.   Move closer or further away from the wall to control the stretch of the back leg. Also you can adjust the bend of the front knee to control the stretch as well.   Hold for 30 seconds, then switch legs.  Repeat 3 times each leg, twice a day.     Plantar Fascia Stretch off of Step  Stand on the bottom step with the toes of the involved foot on the step. Let your heel sink until you feel a stretch and hold. Do not stretch into pain.  Hold for at least 30 seconds.  Repeat 3 times each foot, twice a day.
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
## 2017-10-31 ENCOUNTER — Encounter (INDEPENDENT_AMBULATORY_CARE_PROVIDER_SITE_OTHER): Payer: Self-pay | Admitting: Orthopaedic Surgery

## 2017-10-31 ENCOUNTER — Ambulatory Visit (INDEPENDENT_AMBULATORY_CARE_PROVIDER_SITE_OTHER): Payer: Self-pay | Admitting: Orthopaedic Surgery

## 2017-10-31 DIAGNOSIS — M25561 Pain in right knee: Secondary | ICD-10-CM

## 2017-10-31 DIAGNOSIS — M5441 Lumbago with sciatica, right side: Secondary | ICD-10-CM

## 2017-10-31 DIAGNOSIS — M25511 Pain in right shoulder: Secondary | ICD-10-CM

## 2017-10-31 DIAGNOSIS — M722 Plantar fascial fibromatosis: Secondary | ICD-10-CM

## 2017-10-31 DIAGNOSIS — M5416 Radiculopathy, lumbar region: Secondary | ICD-10-CM
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
## 2017-11-01 ENCOUNTER — Ambulatory Visit: Payer: No Typology Code available for payment source | Admitting: Physical Therapy

## 2017-11-01 ENCOUNTER — Encounter: Payer: Self-pay | Admitting: Physical Therapy

## 2017-11-01 DIAGNOSIS — M25611 Stiffness of right shoulder, not elsewhere classified: Secondary | ICD-10-CM

## 2017-11-01 DIAGNOSIS — M25511 Pain in right shoulder: Secondary | ICD-10-CM | POA: Diagnosis not present

## 2017-11-01 DIAGNOSIS — M5442 Lumbago with sciatica, left side: Secondary | ICD-10-CM

## 2017-11-01 DIAGNOSIS — M6281 Muscle weakness (generalized): Secondary | ICD-10-CM

## 2017-11-01 DIAGNOSIS — R262 Difficulty in walking, not elsewhere classified: Secondary | ICD-10-CM

## 2017-11-01 DIAGNOSIS — M5441 Lumbago with sciatica, right side: Secondary | ICD-10-CM

## 2017-11-01 NOTE — Addendum Note (Signed)
Addended by: Hunt Oris on: 11/01/2017 01:26 PM   Modules accepted: Orders
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
## 2017-11-01 NOTE — Addendum Note (Signed)
Addended by: Precious Bard on: 11/01/2017 08:46 AM   Modules accepted: Orders

## 2017-11-01 NOTE — Patient Instructions (Addendum)
Gastroc, Sitting (Passive)    Sit with strap or towel around ball of foot. Gently pull toward body. Hold _30__ seconds.  Repeat _3__ times per session. Do _1__ sessions per day.  Copyright  VHI. All rights reserved.  Inversion (Eccentric), (Resistance Band)    Pull foot in against resistance band, while sitting.  Slowly release for 3-5 seconds. Use ___Yellow_____ resistance band. __5-10_ reps per set, _1__ sets per day.  http://ecce.exer.us/5   Copyright  VHI. All rights reserved.  ANKLE: Eversion, Unilateral (Band)    Place band around left foot. Keeping heel in place, raise toes of banded foot up and away from body. Do not move hip. Hold __0 to 3_ seconds. Use __Yellow ______ band. __5-10_ reps per set, _1__ sets per day.   Dorsiflexion.   Leg resting on bed,  Have daughter wrap band across toes facing bottom of foot.  Pull  Copyright  VHI. All rights reserved.

## 2017-11-06 ENCOUNTER — Ambulatory Visit: Payer: No Typology Code available for payment source | Admitting: Physical Therapy

## 2017-11-06 ENCOUNTER — Encounter: Payer: Self-pay | Admitting: Physical Therapy

## 2017-11-06 DIAGNOSIS — M25511 Pain in right shoulder: Secondary | ICD-10-CM | POA: Diagnosis not present

## 2017-11-06 DIAGNOSIS — M5441 Lumbago with sciatica, right side: Secondary | ICD-10-CM

## 2017-11-06 DIAGNOSIS — M5442 Lumbago with sciatica, left side: Secondary | ICD-10-CM

## 2017-11-06 DIAGNOSIS — R262 Difficulty in walking, not elsewhere classified: Secondary | ICD-10-CM

## 2017-11-06 DIAGNOSIS — M6281 Muscle weakness (generalized): Secondary | ICD-10-CM

## 2017-11-06 DIAGNOSIS — M25611 Stiffness of right shoulder, not elsewhere classified: Secondary | ICD-10-CM
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
## 2017-11-08 ENCOUNTER — Ambulatory Visit: Payer: No Typology Code available for payment source | Admitting: Physical Therapy

## 2017-11-08 ENCOUNTER — Encounter: Payer: Self-pay | Admitting: Physical Therapy

## 2017-11-08 DIAGNOSIS — M5442 Lumbago with sciatica, left side: Secondary | ICD-10-CM

## 2017-11-08 DIAGNOSIS — M25611 Stiffness of right shoulder, not elsewhere classified: Secondary | ICD-10-CM

## 2017-11-08 DIAGNOSIS — R262 Difficulty in walking, not elsewhere classified: Secondary | ICD-10-CM

## 2017-11-08 DIAGNOSIS — M5441 Lumbago with sciatica, right side: Secondary | ICD-10-CM

## 2017-11-08 DIAGNOSIS — M25511 Pain in right shoulder: Secondary | ICD-10-CM | POA: Diagnosis not present

## 2017-11-08 DIAGNOSIS — M6281 Muscle weakness (generalized): Secondary | ICD-10-CM
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
## 2017-11-13 ENCOUNTER — Ambulatory Visit: Payer: No Typology Code available for payment source | Attending: Orthopaedic Surgery | Admitting: Physical Therapy

## 2017-11-13 ENCOUNTER — Encounter: Payer: Self-pay | Admitting: Physical Therapy

## 2017-11-13 DIAGNOSIS — M6281 Muscle weakness (generalized): Secondary | ICD-10-CM | POA: Diagnosis present

## 2017-11-13 DIAGNOSIS — M25511 Pain in right shoulder: Secondary | ICD-10-CM | POA: Diagnosis present

## 2017-11-13 DIAGNOSIS — M5441 Lumbago with sciatica, right side: Secondary | ICD-10-CM | POA: Insufficient documentation

## 2017-11-13 DIAGNOSIS — M5442 Lumbago with sciatica, left side: Secondary | ICD-10-CM | POA: Diagnosis present

## 2017-11-13 DIAGNOSIS — M25611 Stiffness of right shoulder, not elsewhere classified: Secondary | ICD-10-CM | POA: Insufficient documentation

## 2017-11-13 DIAGNOSIS — R262 Difficulty in walking, not elsewhere classified: Secondary | ICD-10-CM | POA: Diagnosis present

## 2017-11-13 NOTE — Patient Instructions (Signed)
   BRIDGING  While lying on your back with knees bent, tighten your lower abdominals, squeeze your buttocks and then raise your buttocks off the floor/bed as creating a "Bridge" with your body.  Repeat 10-15 times, twice a day.    HIP ABDUCTION - SIDELYING  While lying on your side, slowly raise up your top leg to the side. Keep your knee straight and maintain your toes pointed forward the entire time. Keep your leg in-line with your body.  The bottom leg can be bent to stabilize your body.  Repeat 10-15 times, twice a day.     PRONE HIP EXTENSION  While lying face down with your knee straight, slowly raise up leg off the ground. Maintain a straight knee the entire time.   Repeat 10 times each side, twice a day.
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
## 2017-11-14 ENCOUNTER — Ambulatory Visit: Payer: No Typology Code available for payment source | Admitting: Physical Therapy

## 2017-11-14 ENCOUNTER — Encounter: Payer: Self-pay | Admitting: Physical Therapy

## 2017-11-14 DIAGNOSIS — R262 Difficulty in walking, not elsewhere classified: Secondary | ICD-10-CM

## 2017-11-14 DIAGNOSIS — M25511 Pain in right shoulder: Secondary | ICD-10-CM | POA: Diagnosis not present

## 2017-11-14 DIAGNOSIS — M5442 Lumbago with sciatica, left side: Secondary | ICD-10-CM

## 2017-11-14 DIAGNOSIS — M25611 Stiffness of right shoulder, not elsewhere classified: Secondary | ICD-10-CM

## 2017-11-14 DIAGNOSIS — M5441 Lumbago with sciatica, right side: Secondary | ICD-10-CM

## 2017-11-14 DIAGNOSIS — M6281 Muscle weakness (generalized): Secondary | ICD-10-CM
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
## 2017-11-19 ENCOUNTER — Encounter: Payer: Self-pay | Admitting: Physical Therapy

## 2017-11-19 ENCOUNTER — Ambulatory Visit: Payer: No Typology Code available for payment source | Admitting: Physical Therapy

## 2017-11-19 DIAGNOSIS — M25511 Pain in right shoulder: Secondary | ICD-10-CM | POA: Diagnosis not present

## 2017-11-19 DIAGNOSIS — M5441 Lumbago with sciatica, right side: Secondary | ICD-10-CM

## 2017-11-19 DIAGNOSIS — M5442 Lumbago with sciatica, left side: Secondary | ICD-10-CM

## 2017-11-19 DIAGNOSIS — M25611 Stiffness of right shoulder, not elsewhere classified: Secondary | ICD-10-CM

## 2017-11-19 DIAGNOSIS — R262 Difficulty in walking, not elsewhere classified: Secondary | ICD-10-CM

## 2017-11-19 DIAGNOSIS — M6281 Muscle weakness (generalized): Secondary | ICD-10-CM
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
## 2017-11-19 NOTE — Patient Instructions (Signed)
   ANKLE CIRCLES  Move your ankle in a circular pattern one direction for several repetitions and then reverse the direction.  Repeat 20 times clockwise, 20 times counter-clockwise, twice a day.    ANKLE ABC's   While in a seated position, write out the alphabet in the air with your big toe.  Your ankle should be moving as you perform this.  Repeat 1-2 cycles, twice a day.

## 2017-11-21 ENCOUNTER — Ambulatory Visit: Payer: No Typology Code available for payment source | Admitting: Physical Therapy

## 2017-11-21 ENCOUNTER — Encounter: Payer: Self-pay | Admitting: Physical Therapy

## 2017-11-21 DIAGNOSIS — M6281 Muscle weakness (generalized): Secondary | ICD-10-CM

## 2017-11-21 DIAGNOSIS — M25511 Pain in right shoulder: Secondary | ICD-10-CM | POA: Diagnosis not present

## 2017-11-21 DIAGNOSIS — R262 Difficulty in walking, not elsewhere classified: Secondary | ICD-10-CM

## 2017-11-21 DIAGNOSIS — M5441 Lumbago with sciatica, right side: Secondary | ICD-10-CM

## 2017-11-21 DIAGNOSIS — M25611 Stiffness of right shoulder, not elsewhere classified: Secondary | ICD-10-CM

## 2017-11-21 DIAGNOSIS — M5442 Lumbago with sciatica, left side: Secondary | ICD-10-CM
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
## 2017-11-26 ENCOUNTER — Ambulatory Visit: Payer: No Typology Code available for payment source | Admitting: Physical Therapy

## 2017-11-26 ENCOUNTER — Encounter: Payer: Self-pay | Admitting: Physical Therapy

## 2017-11-26 DIAGNOSIS — M6281 Muscle weakness (generalized): Secondary | ICD-10-CM

## 2017-11-26 DIAGNOSIS — M5441 Lumbago with sciatica, right side: Secondary | ICD-10-CM

## 2017-11-26 DIAGNOSIS — M25511 Pain in right shoulder: Secondary | ICD-10-CM

## 2017-11-26 DIAGNOSIS — R262 Difficulty in walking, not elsewhere classified: Secondary | ICD-10-CM

## 2017-11-26 DIAGNOSIS — M25611 Stiffness of right shoulder, not elsewhere classified: Secondary | ICD-10-CM

## 2017-11-26 DIAGNOSIS — M5442 Lumbago with sciatica, left side: Secondary | ICD-10-CM
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
## 2017-11-30 ENCOUNTER — Encounter: Payer: Self-pay | Admitting: Physical Therapy

## 2017-11-30 ENCOUNTER — Ambulatory Visit: Payer: No Typology Code available for payment source | Admitting: Physical Therapy

## 2017-11-30 DIAGNOSIS — R262 Difficulty in walking, not elsewhere classified: Secondary | ICD-10-CM

## 2017-11-30 DIAGNOSIS — M25511 Pain in right shoulder: Secondary | ICD-10-CM | POA: Diagnosis not present

## 2017-11-30 DIAGNOSIS — M5441 Lumbago with sciatica, right side: Secondary | ICD-10-CM

## 2017-11-30 DIAGNOSIS — M5442 Lumbago with sciatica, left side: Secondary | ICD-10-CM

## 2017-11-30 DIAGNOSIS — M25611 Stiffness of right shoulder, not elsewhere classified: Secondary | ICD-10-CM

## 2017-11-30 DIAGNOSIS — M6281 Muscle weakness (generalized): Secondary | ICD-10-CM
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
## 2017-12-03 ENCOUNTER — Ambulatory Visit (INDEPENDENT_AMBULATORY_CARE_PROVIDER_SITE_OTHER): Payer: Self-pay | Admitting: Internal Medicine

## 2017-12-03 ENCOUNTER — Other Ambulatory Visit: Payer: Self-pay

## 2017-12-03 ENCOUNTER — Ambulatory Visit: Payer: No Typology Code available for payment source | Admitting: Physical Therapy

## 2017-12-03 ENCOUNTER — Encounter: Payer: Self-pay | Admitting: Internal Medicine

## 2017-12-03 ENCOUNTER — Encounter: Payer: Self-pay | Admitting: Physical Therapy

## 2017-12-03 VITALS — BP 123/70 | HR 61 | Temp 98.3°F | Ht 63.0 in | Wt 198.8 lb

## 2017-12-03 DIAGNOSIS — M6281 Muscle weakness (generalized): Secondary | ICD-10-CM

## 2017-12-03 DIAGNOSIS — M5442 Lumbago with sciatica, left side: Secondary | ICD-10-CM

## 2017-12-03 DIAGNOSIS — M722 Plantar fascial fibromatosis: Secondary | ICD-10-CM

## 2017-12-03 DIAGNOSIS — Z791 Long term (current) use of non-steroidal anti-inflammatories (NSAID): Secondary | ICD-10-CM

## 2017-12-03 DIAGNOSIS — M25511 Pain in right shoulder: Secondary | ICD-10-CM | POA: Diagnosis not present

## 2017-12-03 DIAGNOSIS — M25561 Pain in right knee: Secondary | ICD-10-CM

## 2017-12-03 DIAGNOSIS — G8921 Chronic pain due to trauma: Secondary | ICD-10-CM

## 2017-12-03 DIAGNOSIS — M5441 Lumbago with sciatica, right side: Secondary | ICD-10-CM

## 2017-12-03 DIAGNOSIS — M79671 Pain in right foot: Secondary | ICD-10-CM

## 2017-12-03 DIAGNOSIS — Z0000 Encounter for general adult medical examination without abnormal findings: Secondary | ICD-10-CM

## 2017-12-03 DIAGNOSIS — M791 Myalgia, unspecified site: Secondary | ICD-10-CM

## 2017-12-03 DIAGNOSIS — H9192 Unspecified hearing loss, left ear: Secondary | ICD-10-CM

## 2017-12-03 DIAGNOSIS — G8929 Other chronic pain: Secondary | ICD-10-CM

## 2017-12-03 DIAGNOSIS — R262 Difficulty in walking, not elsewhere classified: Secondary | ICD-10-CM

## 2017-12-03 DIAGNOSIS — S0462XS Injury of acoustic nerve, left side, sequela: Secondary | ICD-10-CM

## 2017-12-03 DIAGNOSIS — G4452 New daily persistent headache (NDPH): Secondary | ICD-10-CM

## 2017-12-03 DIAGNOSIS — M25611 Stiffness of right shoulder, not elsewhere classified: Secondary | ICD-10-CM

## 2017-12-03 DIAGNOSIS — M79672 Pain in left foot: Secondary | ICD-10-CM
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
## 2017-12-03 NOTE — Patient Instructions (Signed)
Thank you for visiting clinic today. As we discussed please do not take ibuprofen every day, as it can cause worsening of your headache. I am also giving you a referral to see someone for cognitive therapy to help with your pain symptoms. Follow-up in 3 months.
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
## 2017-12-03 NOTE — Assessment & Plan Note (Signed)
She continued to have this left-sided headache with no red flag or  medical explanation.  Can be drug-induced, as she is taking NSAIDs daily.  Advised to stop taking all the pain medications. She was given a referral to see a psychologist for cognitive behavioral therapy to manage her chronic aches and pains.
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
## 2017-12-03 NOTE — Assessment & Plan Note (Signed)
According to our records she is due for Pap smear and mammogram.  According to patient she had her Pap smear done at [facility] last year, and it was normal and she was told to repeat in 3 years. She had her mammogram 5 years ago.  Mammogram was ordered. We will try to get the records from hospital for her  Pap smear.

## 2017-12-03 NOTE — Assessment & Plan Note (Signed)
She might be having tarsal tunnel syndrome, is complaining of tingling and numbness after standing for some time.  No focal neurologic deficit today.  She is seen in a different physician for the same complaint and will get a steroid injection in her heel during next visit.

## 2017-12-04 ENCOUNTER — Other Ambulatory Visit: Payer: Self-pay | Admitting: Obstetrics and Gynecology

## 2017-12-04 DIAGNOSIS — Z1231 Encounter for screening mammogram for malignant neoplasm of breast: Secondary | ICD-10-CM

## 2017-12-05 ENCOUNTER — Encounter: Payer: Self-pay | Admitting: Physical Therapy

## 2017-12-05 ENCOUNTER — Ambulatory Visit: Payer: No Typology Code available for payment source | Admitting: Physical Therapy

## 2017-12-05 ENCOUNTER — Encounter (HOSPITAL_COMMUNITY): Payer: Self-pay | Admitting: *Deleted

## 2017-12-05 DIAGNOSIS — M6281 Muscle weakness (generalized): Secondary | ICD-10-CM

## 2017-12-05 DIAGNOSIS — M5441 Lumbago with sciatica, right side: Secondary | ICD-10-CM

## 2017-12-05 DIAGNOSIS — M25511 Pain in right shoulder: Secondary | ICD-10-CM

## 2017-12-05 DIAGNOSIS — R262 Difficulty in walking, not elsewhere classified: Secondary | ICD-10-CM

## 2017-12-05 DIAGNOSIS — M5442 Lumbago with sciatica, left side: Secondary | ICD-10-CM

## 2017-12-05 DIAGNOSIS — M25611 Stiffness of right shoulder, not elsewhere classified: Secondary | ICD-10-CM

## 2017-12-05 NOTE — Progress Notes (Signed)
Internal Medicine Clinic Attending  Case discussed with Dr. Amin at the time of the visit.  We reviewed the resident's history and exam and pertinent patient test results.  I agree with the assessment, diagnosis, and plan of care documented in the resident's note.    
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
## 2017-12-11 ENCOUNTER — Ambulatory Visit: Payer: No Typology Code available for payment source | Admitting: Physical Therapy

## 2017-12-11 ENCOUNTER — Encounter: Payer: Self-pay | Admitting: Physical Therapy

## 2017-12-11 DIAGNOSIS — M25511 Pain in right shoulder: Secondary | ICD-10-CM | POA: Diagnosis not present

## 2017-12-11 DIAGNOSIS — M25611 Stiffness of right shoulder, not elsewhere classified: Secondary | ICD-10-CM

## 2017-12-11 DIAGNOSIS — M5441 Lumbago with sciatica, right side: Secondary | ICD-10-CM

## 2017-12-11 DIAGNOSIS — R262 Difficulty in walking, not elsewhere classified: Secondary | ICD-10-CM

## 2017-12-11 DIAGNOSIS — M5442 Lumbago with sciatica, left side: Secondary | ICD-10-CM

## 2017-12-11 DIAGNOSIS — M6281 Muscle weakness (generalized): Secondary | ICD-10-CM
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
## 2017-12-11 NOTE — Patient Instructions (Signed)
   Copyright  VHI. All rights reserved.  HIP: Flexion / KNEE: Extension, Straight Leg Raise   Raise leg, keeping knee straight. Perform slowly. 10___ reps per set, __2_ sets per day, __7_ days per week     http://[email or web address].exer.us/372   Copyright  VHI. All rights reserved.     Raise leg until knee is straight. _20__ reps per set, 2___ sets per day, __7_ days per week   Quad Set   Slowly tighten muscles on thigh of straight leg while counting out loud to 5____. Repeat with other leg. Repeat ___20_ times. Do ___2_ sessions per day.  SIT TO STAND: Feet Narrow    . Lean chest forward. Raise hips and straighten knees to stand. __10_ reps per set, __2_ sets per day, __7_ days per week Hold onto a support.  Copyright  VHI. All rights reserved.

## 2017-12-13 ENCOUNTER — Encounter: Payer: Self-pay | Admitting: Physical Therapy

## 2017-12-13 ENCOUNTER — Ambulatory Visit: Payer: No Typology Code available for payment source | Attending: Orthopaedic Surgery | Admitting: Physical Therapy

## 2017-12-13 DIAGNOSIS — R292 Abnormal reflex: Secondary | ICD-10-CM | POA: Diagnosis present

## 2017-12-13 DIAGNOSIS — M5441 Lumbago with sciatica, right side: Secondary | ICD-10-CM

## 2017-12-13 DIAGNOSIS — M6281 Muscle weakness (generalized): Secondary | ICD-10-CM | POA: Diagnosis present

## 2017-12-13 DIAGNOSIS — H9192 Unspecified hearing loss, left ear: Secondary | ICD-10-CM | POA: Insufficient documentation

## 2017-12-13 DIAGNOSIS — M5442 Lumbago with sciatica, left side: Secondary | ICD-10-CM | POA: Diagnosis present

## 2017-12-13 DIAGNOSIS — M25611 Stiffness of right shoulder, not elsewhere classified: Secondary | ICD-10-CM

## 2017-12-13 DIAGNOSIS — R94120 Abnormal auditory function study: Secondary | ICD-10-CM | POA: Insufficient documentation

## 2017-12-13 DIAGNOSIS — M25511 Pain in right shoulder: Secondary | ICD-10-CM | POA: Diagnosis not present

## 2017-12-13 DIAGNOSIS — R262 Difficulty in walking, not elsewhere classified: Secondary | ICD-10-CM | POA: Diagnosis present
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
## 2017-12-17 ENCOUNTER — Encounter: Payer: Self-pay | Admitting: Physical Therapy

## 2017-12-18 ENCOUNTER — Encounter: Payer: Self-pay | Admitting: Physical Therapy

## 2017-12-20 ENCOUNTER — Encounter: Payer: Self-pay | Admitting: Physical Therapy

## 2017-12-20 ENCOUNTER — Ambulatory Visit: Payer: No Typology Code available for payment source | Admitting: Physical Therapy

## 2017-12-20 DIAGNOSIS — M25511 Pain in right shoulder: Secondary | ICD-10-CM

## 2017-12-20 DIAGNOSIS — M6281 Muscle weakness (generalized): Secondary | ICD-10-CM

## 2017-12-20 DIAGNOSIS — M5442 Lumbago with sciatica, left side: Secondary | ICD-10-CM

## 2017-12-20 DIAGNOSIS — R262 Difficulty in walking, not elsewhere classified: Secondary | ICD-10-CM

## 2017-12-20 DIAGNOSIS — M5441 Lumbago with sciatica, right side: Secondary | ICD-10-CM

## 2017-12-20 DIAGNOSIS — M25611 Stiffness of right shoulder, not elsewhere classified: Secondary | ICD-10-CM
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
## 2017-12-21 ENCOUNTER — Encounter: Payer: Self-pay | Admitting: Physical Therapy

## 2017-12-21 ENCOUNTER — Ambulatory Visit: Payer: No Typology Code available for payment source | Admitting: Physical Therapy

## 2017-12-21 DIAGNOSIS — M5441 Lumbago with sciatica, right side: Secondary | ICD-10-CM

## 2017-12-21 DIAGNOSIS — M5442 Lumbago with sciatica, left side: Secondary | ICD-10-CM

## 2017-12-21 DIAGNOSIS — M6281 Muscle weakness (generalized): Secondary | ICD-10-CM

## 2017-12-21 DIAGNOSIS — M25511 Pain in right shoulder: Secondary | ICD-10-CM | POA: Diagnosis not present

## 2017-12-21 DIAGNOSIS — M25611 Stiffness of right shoulder, not elsewhere classified: Secondary | ICD-10-CM

## 2017-12-21 DIAGNOSIS — R262 Difficulty in walking, not elsewhere classified: Secondary | ICD-10-CM
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
## 2017-12-25 ENCOUNTER — Encounter: Payer: No Typology Code available for payment source | Attending: Psychology | Admitting: Psychology

## 2017-12-25 DIAGNOSIS — M5416 Radiculopathy, lumbar region: Secondary | ICD-10-CM | POA: Insufficient documentation

## 2017-12-25 DIAGNOSIS — F4310 Post-traumatic stress disorder, unspecified: Secondary | ICD-10-CM | POA: Insufficient documentation

## 2017-12-27 ENCOUNTER — Ambulatory Visit: Payer: No Typology Code available for payment source | Admitting: Audiology

## 2017-12-27 DIAGNOSIS — M25511 Pain in right shoulder: Secondary | ICD-10-CM | POA: Diagnosis not present

## 2017-12-27 DIAGNOSIS — R292 Abnormal reflex: Secondary | ICD-10-CM

## 2017-12-27 DIAGNOSIS — H9192 Unspecified hearing loss, left ear: Secondary | ICD-10-CM

## 2017-12-27 DIAGNOSIS — R94120 Abnormal auditory function study: Secondary | ICD-10-CM
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
## 2017-12-28 ENCOUNTER — Ambulatory Visit (INDEPENDENT_AMBULATORY_CARE_PROVIDER_SITE_OTHER): Payer: Self-pay | Admitting: Orthopaedic Surgery

## 2017-12-28 ENCOUNTER — Telehealth: Payer: Self-pay

## 2017-12-28 ENCOUNTER — Encounter (INDEPENDENT_AMBULATORY_CARE_PROVIDER_SITE_OTHER): Payer: Self-pay | Admitting: Orthopaedic Surgery

## 2017-12-28 DIAGNOSIS — M722 Plantar fascial fibromatosis: Secondary | ICD-10-CM

## 2017-12-28 MED ORDER — BUPIVACAINE HCL 0.25 % IJ SOLN
0.3300 mL | INTRAMUSCULAR | Status: AC | PRN
  Administered 2017-12-28: .33 mL

## 2017-12-28 MED ORDER — METHYLPREDNISOLONE ACETATE 40 MG/ML IJ SUSP
13.3300 mg | INTRAMUSCULAR | Status: AC | PRN
  Administered 2017-12-28: 13.33 mg

## 2017-12-28 MED ORDER — LIDOCAINE HCL 1 % IJ SOLN
0.3300 mL | INTRAMUSCULAR | Status: AC | PRN
  Administered 2017-12-28: .33 mL
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
## 2017-12-28 NOTE — Telephone Encounter (Signed)
Requesting to speak with Dr Reesa Chew, for test result. Please call back.

## 2017-12-31 NOTE — Telephone Encounter (Signed)
Call the patient with no response.

## 2018-01-02 ENCOUNTER — Other Ambulatory Visit: Payer: Self-pay | Admitting: Internal Medicine

## 2018-01-02 ENCOUNTER — Telehealth: Payer: Self-pay | Admitting: Internal Medicine

## 2018-01-02 DIAGNOSIS — H9192 Unspecified hearing loss, left ear: Secondary | ICD-10-CM

## 2018-01-02 NOTE — Telephone Encounter (Signed)
Pls call [employer]- [phone] they are waiting on the results from Dr Reesa Chew in order to schedule appt

## 2018-01-02 NOTE — Telephone Encounter (Signed)
Placed call to [phone]. It is a recording for [facility]. Called patient back via [address] [place]. Relayed info from Dr Reesa Chew. Patient states Colletta [place] from ENT office has been waiting since last week for results from Dr. Reesa Chew in order to schedule appt. There is only one appt available this month. Hubbard Hartshorn, RN, BSN

## 2018-01-02 NOTE — Telephone Encounter (Signed)
Called the patient again with no response. According to audiology report she was having some difficulties with testing and there was some possible malingering, they were recommending ENT evaluation. A referral was placed in for ENT evaluation.

## 2018-01-03 ENCOUNTER — Telehealth: Payer: Self-pay | Admitting: Internal Medicine

## 2018-01-03 ENCOUNTER — Telehealth: Payer: Self-pay | Admitting: *Deleted

## 2018-01-03 NOTE — Telephone Encounter (Signed)
PATIENT WANTS RESULTS FROM EAR TEST DONE ON 12/27/17

## 2018-01-03 NOTE — Telephone Encounter (Signed)
I called pt via [facility] ID # [id number], about her ENT referral ; no answer, message left "to call the office". Also called Christy Sartorius son, contact person listed on chart, no answer.

## 2018-01-03 NOTE — Telephone Encounter (Signed)
Return call to pt Sara Olson) - stated she does understand some English but will let me know if she does not.; she also speaks [place]. Informed [facility] ([facility] people) has hearing test results (per Yvonna Alanis) and they will call her directly with ENT appt. Stated wonderful - she wanted to be sure they have test results. Instructed if she does not hear from [facility] by next week, to call us back .She stated ok.

## 2018-01-03 NOTE — Telephone Encounter (Signed)
See my telephone encounter form today.

## 2018-01-03 NOTE — Telephone Encounter (Signed)
Thank you :)

## 2018-01-04 NOTE — Telephone Encounter (Signed)
Please see phone encounter from 01/03/2018. Hubbard Hartshorn, RN, BSN

## 2018-01-08 ENCOUNTER — Encounter: Payer: Self-pay | Admitting: *Deleted

## 2018-01-09 NOTE — Telephone Encounter (Signed)
Duplicate referral from May - Patient seen by [facility] on 12/27/2017.  Rec'd call from [facility] requesting notes to be faxed to [phone].  Notes faxed to attention [facility] or [facility] [place] for an ENT appt. On 01/01/18 by Enedina Finner and  additional [facility] audiology testing notes were sent to [facility] on 01/02/18 by Yvonna Alanis

## 2018-01-22 ENCOUNTER — Ambulatory Visit (INDEPENDENT_AMBULATORY_CARE_PROVIDER_SITE_OTHER): Payer: Self-pay | Admitting: Orthopaedic Surgery

## 2018-01-22 DIAGNOSIS — M25561 Pain in right knee: Secondary | ICD-10-CM

## 2018-01-22 DIAGNOSIS — G8929 Other chronic pain: Secondary | ICD-10-CM

## 2018-01-22 MED ORDER — METHYLPREDNISOLONE ACETATE 40 MG/ML IJ SUSP
40.0000 mg | INTRAMUSCULAR | Status: AC | PRN
  Administered 2018-01-22: 40 mg via INTRA_ARTICULAR

## 2018-01-22 MED ORDER — LIDOCAINE HCL 1 % IJ SOLN
2.0000 mL | INTRAMUSCULAR | Status: AC | PRN
  Administered 2018-01-22: 2 mL

## 2018-01-22 MED ORDER — BUPIVACAINE HCL 0.25 % IJ SOLN
2.0000 mL | INTRAMUSCULAR | Status: AC | PRN
  Administered 2018-01-22: 2 mL via INTRA_ARTICULAR
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
## 2018-01-29 ENCOUNTER — Ambulatory Visit: Payer: Self-pay | Admitting: Audiology

## 2018-01-29 ENCOUNTER — Ambulatory Visit: Payer: No Typology Code available for payment source

## 2018-01-29 ENCOUNTER — Ambulatory Visit: Payer: Self-pay | Attending: Orthopaedic Surgery | Admitting: Audiology

## 2018-01-29 DIAGNOSIS — Z538 Procedure and treatment not carried out for other reasons: Secondary | ICD-10-CM | POA: Insufficient documentation

## 2018-01-29 NOTE — Progress Notes (Signed)
Ma de Worthington arrived 01/29/2018 for a 1pm appointment that she called to schedule here, but upon review of the chart it was determined that also had a referral to an ENT. Ma de Tracie Harrier has an "orange card" and per Colletta [place] and [place], contact for referrals through the "orange card" it was determined that [facility] has an appointment Sepember 20th at 1:30pm with Radene Journey, MD ENT.  This information was conveyed to [employer]. She states that she plans to keep this appointment.  Deborah L. Heide Spark, Au.D., CCC-A Doctor of Audiology 01/29/2018

## 2018-01-31 ENCOUNTER — Ambulatory Visit
Admission: RE | Admit: 2018-01-31 | Discharge: 2018-01-31 | Disposition: A | Discharge status: Home / Self Care | Payer: No Typology Code available for payment source | Source: Ambulatory Visit | Attending: Obstetrics and Gynecology | Admitting: Obstetrics and Gynecology

## 2018-01-31 ENCOUNTER — Ambulatory Visit (HOSPITAL_COMMUNITY): Admission: RE | Admit: 2018-01-31 | Payer: Self-pay | Source: Ambulatory Visit

## 2018-01-31 ENCOUNTER — Other Ambulatory Visit (HOSPITAL_COMMUNITY): Payer: Self-pay | Admitting: Obstetrics and Gynecology

## 2018-01-31 ENCOUNTER — Encounter (HOSPITAL_COMMUNITY): Payer: Self-pay

## 2018-01-31 ENCOUNTER — Ambulatory Visit (HOSPITAL_COMMUNITY)
Admission: RE | Admit: 2018-01-31 | Discharge: 2018-01-31 | Disposition: A | Discharge status: Home / Self Care | Payer: Self-pay | Source: Ambulatory Visit | Attending: Obstetrics and Gynecology | Admitting: Obstetrics and Gynecology

## 2018-01-31 ENCOUNTER — Other Ambulatory Visit (HOSPITAL_COMMUNITY): Payer: Self-pay | Admitting: *Deleted

## 2018-01-31 VITALS — BP 122/80 | Ht 63.0 in | Wt 198.2 lb

## 2018-01-31 DIAGNOSIS — N644 Mastodynia: Secondary | ICD-10-CM

## 2018-01-31 DIAGNOSIS — N6320 Unspecified lump in the left breast, unspecified quadrant: Secondary | ICD-10-CM

## 2018-01-31 DIAGNOSIS — Z1231 Encounter for screening mammogram for malignant neoplasm of breast: Secondary | ICD-10-CM

## 2018-01-31 DIAGNOSIS — Z1239 Encounter for other screening for malignant neoplasm of breast: Secondary | ICD-10-CM

## 2018-01-31 NOTE — Patient Instructions (Signed)
Explained breast self awareness with Sara Olson. Patient did not need a Pap smear today due to last Pap smear was 01/09/2017. Let her know B[facility]CP will cover Pap smears every 3 years unless has a history of abnormal Pap smears. Referred patient to the [facility] for a diagnostic mammogram and possible bilateral breast ultrasounds. Appointment scheduled for Thursday, January 31, 2018 at [place] verbalized understanding.  [name], [name], RN 3:05 PM

## 2018-01-31 NOTE — Progress Notes (Signed)
Complaints of left outer breast pain that comes and goes. Patient rated the pain at a 2 out of 10.  Pap Smear: Pap smear not completed today. Last Pap smear was 01/09/2017 at the [facility] and normal. Per patient has no history of an abnormal Pap smear. Last Pap smear result is in Epic.  Physical exam: Breasts Breasts symmetrical. No skin abnormalities bilateral breasts. No nipple retraction bilateral breasts. No nipple discharge bilateral breasts. No lymphadenopathy. No lumps palpated bilateral breasts. Complaints of left outer breast tenderness at 3 o'clock 5 cm from the nipple on exam. Complaints of right outer breast tenderness at 9 o'clock on exam. Referred patient to the [facility] for a diagnostic mammogram and possible bilateral breast ultrasounds. Appointment scheduled for Thursday, January 31, 2018 at 1420.        Pelvic/Bimanual No Pap smear completed today since last Pap smear was 01/09/2017. Pap smear not indicated per BCCCP guidelines.   Smoking History: Patient has never smoked.  Patient Navigation: Patient education provided. Access to services provided for patient through BCCCP program.   Breast and Cervical Cancer Risk Assessment: Patient has no family history of breast cancer, known genetic mutations, or radiation treatment to the chest before age 31. Patient has no history of cervical dysplasia, immunocompromised, or DES exposure in-utero.  Risk Assessment    Risk Scores      01/31/2018   Last edited by: Armond Hang, LPN   5-year risk: 0.4 %   Lifetime risk: 4.9 %

## 2018-02-01 ENCOUNTER — Encounter (HOSPITAL_COMMUNITY): Payer: Self-pay | Admitting: *Deleted

## 2018-02-05 ENCOUNTER — Other Ambulatory Visit (HOSPITAL_COMMUNITY): Payer: Self-pay | Admitting: Obstetrics and Gynecology

## 2018-02-05 ENCOUNTER — Ambulatory Visit
Admission: RE | Admit: 2018-02-05 | Discharge: 2018-02-05 | Disposition: A | Discharge status: Home / Self Care | Payer: No Typology Code available for payment source | Source: Ambulatory Visit | Attending: Obstetrics and Gynecology | Admitting: Obstetrics and Gynecology

## 2018-02-05 DIAGNOSIS — N6320 Unspecified lump in the left breast, unspecified quadrant: Secondary | ICD-10-CM

## 2018-02-05 HISTORY — PX: BREAST BIOPSY: SHX20

## 2018-02-11 ENCOUNTER — Ambulatory Visit (INDEPENDENT_AMBULATORY_CARE_PROVIDER_SITE_OTHER): Payer: Self-pay | Admitting: Internal Medicine

## 2018-02-11 ENCOUNTER — Other Ambulatory Visit: Payer: Self-pay

## 2018-02-11 ENCOUNTER — Encounter: Payer: Self-pay | Admitting: Internal Medicine

## 2018-02-11 VITALS — BP 126/70 | HR 64 | Temp 98.6°F | Ht 63.0 in | Wt 201.7 lb

## 2018-02-11 DIAGNOSIS — H9192 Unspecified hearing loss, left ear: Secondary | ICD-10-CM

## 2018-02-11 DIAGNOSIS — H9190 Unspecified hearing loss, unspecified ear: Secondary | ICD-10-CM

## 2018-02-11 DIAGNOSIS — M25561 Pain in right knee: Secondary | ICD-10-CM

## 2018-02-11 DIAGNOSIS — Z9889 Other specified postprocedural states: Secondary | ICD-10-CM

## 2018-02-11 DIAGNOSIS — D242 Benign neoplasm of left breast: Secondary | ICD-10-CM

## 2018-02-11 DIAGNOSIS — G8929 Other chronic pain: Secondary | ICD-10-CM

## 2018-02-11 DIAGNOSIS — R238 Other skin changes: Secondary | ICD-10-CM

## 2018-02-11 NOTE — Assessment & Plan Note (Signed)
Patient is having central hearing loss according to ENT note and they were recommending hearing aid.  We will try to get information whether she can get a hearing aid with the help of orange card. She was also advised to talk with her discharge to see if they have any resources to get her a hearing aid.

## 2018-02-11 NOTE — Assessment & Plan Note (Signed)
She came today to discuss her left breast biopsy results. 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  She will continue follow-up with yearly mammogram.

## 2018-02-11 NOTE — Progress Notes (Signed)
   CC: To discuss her left breast biopsy results.  HPI:  [name] is a 47 y.o. with no significant past medical history came to the clinic to discuss her left breast biopsy results.  Patient has a recent left breast biopsy, once there was a suspicious mass on mammogram, biopsy shows fibroadenoma and recommendations are to do a routine mammogram yearly. She was also concerned about changing color of her bruise on her left breast following biopsy.  She was also asking to shave off a small shallow papule and lateral side of her right eye closed to the right eye corner.  Is asking to see a dermatologist for that purpose-referral was provided.  Her pain is improving and she is not taking pain meds on a regular basis now.  She was also inquiring about getting hearing aid with orange card as recommended by ENT.  No past medical history on file.   Review of Systems: Negative except mentioned in HPI.  Physical Exam:  Vitals:   02/11/18 1454  BP: 126/70  Pulse: 64  Temp: 98.6 F (37 C)  TempSrc: Oral  SpO2: 99%  Weight: 201 lb 11.2 oz (91.5 kg)  Height: 5\' 3"  (1.6 m)    General: Vital signs reviewed.  Patient is well-developed and well-nourished, in no acute distress and cooperative with exam.  Head: Normocephalic and atraumatic. Eyes: EOMI, conjunctivae normal, no scleral icterus.  Cardiovascular: RRR, S1 normal, S2 normal, no murmurs, gallops, or rubs. Pulmonary/Chest: Clear to auscultation bilaterally, no wheezes, rales, or rhonchi. Abdominal: Soft, non-tender, non-distended, BS +,  Extremities: No lower extremity edema bilaterally,  pulses symmetric and intact bilaterally. No cyanosis or clubbing. Skin: Warm, dry and intact.  Healing bruise on left breast. Is small yellow color papule on right temple area close to the corner of her eye. Psychiatric: Normal mood and affect. speech and behavior is normal. Cognition and memory are normal.  Assessment & Plan:    See Encounters Tab for problem based charting.  Patient discussed with Dr. Daryll Drown.

## 2018-02-11 NOTE — Patient Instructions (Signed)
Thank you for visiting clinic today. As we discussed you are not having any breast cancer. Please follow-up with your mammogram in 1 year. We will send recommendations for your hearing aid, they will contact you if they can provide you with a hearing aid with orange card. It is important that you do not take pain medicine on a regular basis, I am glad that you are able to wean yourself off. Exercise regularly as that will help with your symptoms. Also giving you a referral to see a dermatologist for your small mole on right eye, they will call you with an appointment, it normally takes many months to get in. Please follow-up in 64-month.

## 2018-02-11 NOTE — Assessment & Plan Note (Signed)
Is having a very small papule which she wants to shaved off. We provided her a referral to see a dermatologist as it was too close to her eye.

## 2018-02-11 NOTE — Assessment & Plan Note (Signed)
She recently got a steroid injection in right knee by her orthopedic surgeon resulted in good resolution of her pain.  Continue follow-up.

## 2018-02-12 ENCOUNTER — Encounter: Payer: Self-pay | Admitting: Psychology
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
## 2018-02-13 NOTE — Progress Notes (Signed)
Internal Medicine Clinic Attending  Case discussed with Dr. Amin at the time of the visit.  We reviewed the resident's history and exam and pertinent patient test results.  I agree with the assessment, diagnosis, and plan of care documented in the resident's note.    

## 2018-02-25 ENCOUNTER — Encounter: Payer: Self-pay | Admitting: Internal Medicine

## 2018-02-26 ENCOUNTER — Encounter: Payer: Self-pay | Admitting: Psychology

## 2018-02-26 ENCOUNTER — Encounter: Payer: No Typology Code available for payment source | Attending: Psychology | Admitting: Psychology

## 2018-02-26 DIAGNOSIS — F4310 Post-traumatic stress disorder, unspecified: Secondary | ICD-10-CM

## 2018-02-26 DIAGNOSIS — M5416 Radiculopathy, lumbar region: Secondary | ICD-10-CM
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
## 2018-03-25 ENCOUNTER — Encounter: Payer: No Typology Code available for payment source | Admitting: Psychology

## 2018-04-01 ENCOUNTER — Encounter: Payer: Self-pay | Admitting: Internal Medicine

## 2018-04-08 ENCOUNTER — Ambulatory Visit: Payer: Self-pay | Admitting: Psychology

## 2018-04-08 ENCOUNTER — Encounter: Payer: Self-pay | Attending: Psychology | Admitting: Psychology

## 2018-04-08 DIAGNOSIS — M5416 Radiculopathy, lumbar region: Secondary | ICD-10-CM | POA: Insufficient documentation

## 2018-04-08 DIAGNOSIS — F4310 Post-traumatic stress disorder, unspecified: Secondary | ICD-10-CM | POA: Insufficient documentation

## 2018-04-08 DIAGNOSIS — M5116 Intervertebral disc disorders with radiculopathy, lumbar region: Secondary | ICD-10-CM

## 2018-04-09 ENCOUNTER — Encounter: Payer: Self-pay | Admitting: Psychology
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
## 2018-04-22 ENCOUNTER — Encounter: Payer: Self-pay | Admitting: Psychology

## 2018-04-22 ENCOUNTER — Ambulatory Visit: Payer: Self-pay | Admitting: Psychology

## 2018-04-24 ENCOUNTER — Ambulatory Visit: Payer: Self-pay

## 2018-07-03 ENCOUNTER — Ambulatory Visit (INDEPENDENT_AMBULATORY_CARE_PROVIDER_SITE_OTHER): Payer: Self-pay | Admitting: Orthopaedic Surgery

## 2018-07-03 ENCOUNTER — Encounter (INDEPENDENT_AMBULATORY_CARE_PROVIDER_SITE_OTHER): Payer: Self-pay | Admitting: Orthopaedic Surgery

## 2018-07-03 DIAGNOSIS — M722 Plantar fascial fibromatosis: Secondary | ICD-10-CM

## 2018-07-03 DIAGNOSIS — M25561 Pain in right knee: Secondary | ICD-10-CM

## 2018-07-03 DIAGNOSIS — G8929 Other chronic pain: Secondary | ICD-10-CM

## 2018-07-03 MED ORDER — BUPIVACAINE HCL 0.5 % IJ SOLN
2.0000 mL | INTRAMUSCULAR | Status: AC | PRN
  Administered 2018-07-03: 2 mL via INTRA_ARTICULAR

## 2018-07-03 MED ORDER — METHYLPREDNISOLONE ACETATE 40 MG/ML IJ SUSP
40.0000 mg | INTRAMUSCULAR | Status: AC | PRN
  Administered 2018-07-03: 40 mg via INTRA_ARTICULAR

## 2018-07-03 MED ORDER — BUPIVACAINE HCL 0.5 % IJ SOLN
1.0000 mL | INTRAMUSCULAR | Status: AC | PRN
  Administered 2018-07-03: 1 mL

## 2018-07-03 MED ORDER — LIDOCAINE HCL 1 % IJ SOLN
2.0000 mL | INTRAMUSCULAR | Status: AC | PRN
  Administered 2018-07-03: 2 mL

## 2018-07-03 MED ORDER — LIDOCAINE HCL 1 % IJ SOLN
1.0000 mL | INTRAMUSCULAR | Status: AC | PRN
  Administered 2018-07-03: 1 mL

## 2018-07-03 MED ORDER — METHYLPREDNISOLONE ACETATE 40 MG/ML IJ SUSP
40.0000 mg | INTRAMUSCULAR | Status: AC | PRN
  Administered 2018-07-03: 40 mg
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
## 2018-08-14 ENCOUNTER — Other Ambulatory Visit: Payer: Self-pay | Admitting: Internal Medicine

## 2018-08-14 DIAGNOSIS — R238 Other skin changes: Secondary | ICD-10-CM

## 2018-08-26 ENCOUNTER — Encounter: Payer: No Typology Code available for payment source | Admitting: Internal Medicine

## 2018-11-10 ENCOUNTER — Encounter: Payer: Self-pay | Admitting: *Deleted

## 2019-03-07 ENCOUNTER — Encounter (INDEPENDENT_AMBULATORY_CARE_PROVIDER_SITE_OTHER): Payer: Self-pay
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
## 2022-03-08 ENCOUNTER — Other Ambulatory Visit: Payer: Self-pay | Admitting: Obstetrics & Gynecology

## 2022-03-08 DIAGNOSIS — Z1231 Encounter for screening mammogram for malignant neoplasm of breast: Secondary | ICD-10-CM

## 2022-03-30 ENCOUNTER — Ambulatory Visit
Admission: RE | Admit: 2022-03-30 | Discharge: 2022-03-30 | Disposition: A | Discharge status: Home / Self Care | Payer: No Typology Code available for payment source | Source: Ambulatory Visit | Attending: Obstetrics & Gynecology | Admitting: Obstetrics & Gynecology

## 2022-03-30 DIAGNOSIS — Z1231 Encounter for screening mammogram for malignant neoplasm of breast: Secondary | ICD-10-CM

## 2022-04-04 ENCOUNTER — Other Ambulatory Visit: Payer: Self-pay | Admitting: Obstetrics & Gynecology

## 2022-04-04 DIAGNOSIS — R928 Other abnormal and inconclusive findings on diagnostic imaging of breast: Secondary | ICD-10-CM

## 2022-04-11 ENCOUNTER — Other Ambulatory Visit: Payer: Self-pay

## 2022-04-11 DIAGNOSIS — R928 Other abnormal and inconclusive findings on diagnostic imaging of breast: Secondary | ICD-10-CM

## 2022-04-27 ENCOUNTER — Ambulatory Visit
Admission: RE | Admit: 2022-04-27 | Discharge: 2022-04-27 | Disposition: A | Discharge status: Home / Self Care | Payer: No Typology Code available for payment source | Source: Ambulatory Visit | Attending: Obstetrics and Gynecology | Admitting: Obstetrics and Gynecology

## 2022-04-27 ENCOUNTER — Ambulatory Visit: Payer: Self-pay | Admitting: Hematology and Oncology

## 2022-04-27 VITALS — BP 118/82 | Wt 185.6 lb

## 2022-04-27 DIAGNOSIS — R928 Other abnormal and inconclusive findings on diagnostic imaging of breast: Secondary | ICD-10-CM

## 2022-04-27 DIAGNOSIS — Z1211 Encounter for screening for malignant neoplasm of colon: Secondary | ICD-10-CM
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
## 2022-04-27 NOTE — Patient Instructions (Signed)
[place] about self breast awareness and gave educational materials to take home. Patient did not need a Pap smear today due to last Pap smear was in 2023 per patient. Let her know [facility]CP will cover Pap smears every 3 years unless has a history of abnormal Pap smears. Referred patient to the [facility] for diagnostic mammogram. Appointment scheduled for 04/27/22. Patient aware of appointment and will be there. Let patient know will follow up with her within the next couple weeks with results. [place] verbalized understanding.  Melodye Ped, NP 1:22 PM

## 2022-05-02 NOTE — Progress Notes (Signed)
FIT test has been mailed to the pt 05/02/2022.
